# Patient Record
Sex: Male | Born: 1970 | Race: White | Hispanic: No | Marital: Married
Health system: Southern US, Community
[De-identification: ages and names within clinical notes are randomized; demographics above are authoritative.]

---

## 2009-12-03 ENCOUNTER — Encounter: Admission: RE | Admit: 2009-12-03 | Discharge: 2009-12-03 | Payer: Self-pay | Admitting: Internal Medicine

## 2013-03-05 ENCOUNTER — Telehealth (HOSPITAL_COMMUNITY): Payer: Self-pay | Admitting: Internal Medicine

## 2013-03-13 ENCOUNTER — Other Ambulatory Visit (HOSPITAL_COMMUNITY): Payer: Self-pay | Admitting: Internal Medicine

## 2013-03-13 ENCOUNTER — Telehealth (HOSPITAL_COMMUNITY): Payer: Self-pay | Admitting: Internal Medicine

## 2013-03-13 DIAGNOSIS — Z8249 Family history of ischemic heart disease and other diseases of the circulatory system: Secondary | ICD-10-CM

## 2013-03-19 ENCOUNTER — Telehealth (HOSPITAL_COMMUNITY): Payer: Self-pay | Admitting: Internal Medicine

## 2013-03-25 ENCOUNTER — Encounter (HOSPITAL_COMMUNITY): Payer: Self-pay | Admitting: *Deleted

## 2013-03-26 ENCOUNTER — Telehealth (HOSPITAL_COMMUNITY): Payer: Self-pay | Admitting: *Deleted

## 2013-04-07 ENCOUNTER — Telehealth (HOSPITAL_COMMUNITY): Payer: Self-pay | Admitting: *Deleted

## 2013-04-08 ENCOUNTER — Ambulatory Visit (HOSPITAL_COMMUNITY)
Admission: RE | Admit: 2013-04-08 | Discharge: 2013-04-08 | Disposition: A | Discharge status: Home / Self Care | Payer: Managed Care, Other (non HMO) | Source: Ambulatory Visit | Attending: Cardiology | Admitting: Cardiology

## 2013-04-08 DIAGNOSIS — E669 Obesity, unspecified: Secondary | ICD-10-CM | POA: Insufficient documentation

## 2013-04-08 DIAGNOSIS — Z8249 Family history of ischemic heart disease and other diseases of the circulatory system: Secondary | ICD-10-CM | POA: Insufficient documentation

## 2013-09-08 ENCOUNTER — Other Ambulatory Visit: Payer: Self-pay | Admitting: Internal Medicine

## 2013-09-08 DIAGNOSIS — R7989 Other specified abnormal findings of blood chemistry: Secondary | ICD-10-CM

## 2013-09-30 ENCOUNTER — Ambulatory Visit
Admission: RE | Admit: 2013-09-30 | Discharge: 2013-09-30 | Disposition: A | Discharge status: Home / Self Care | Payer: Managed Care, Other (non HMO) | Source: Ambulatory Visit | Attending: Internal Medicine | Admitting: Internal Medicine

## 2013-09-30 DIAGNOSIS — R7989 Other specified abnormal findings of blood chemistry: Secondary | ICD-10-CM

## 2020-10-14 ENCOUNTER — Other Ambulatory Visit: Payer: Self-pay | Admitting: Internal Medicine

## 2020-10-14 DIAGNOSIS — E7800 Pure hypercholesterolemia, unspecified: Secondary | ICD-10-CM

## 2020-11-29 ENCOUNTER — Ambulatory Visit
Admission: RE | Admit: 2020-11-29 | Discharge: 2020-11-29 | Disposition: A | Discharge status: Home / Self Care | Payer: No Typology Code available for payment source | Source: Ambulatory Visit | Attending: Internal Medicine | Admitting: Internal Medicine

## 2020-11-29 DIAGNOSIS — E7800 Pure hypercholesterolemia, unspecified: Secondary | ICD-10-CM

## 2021-11-08 ENCOUNTER — Other Ambulatory Visit (HOSPITAL_COMMUNITY): Payer: Self-pay

## 2021-11-08 MED ORDER — WEGOVY 1.7 MG/0.75ML ~~LOC~~ SOAJ
SUBCUTANEOUS | 2 refills | Status: AC
  Filled 2021-11-08: qty 3, 28d supply, fill #0

## 2021-11-11 ENCOUNTER — Other Ambulatory Visit: Payer: Self-pay | Admitting: Internal Medicine

## 2021-11-11 DIAGNOSIS — R911 Solitary pulmonary nodule: Secondary | ICD-10-CM

## 2021-12-02 ENCOUNTER — Other Ambulatory Visit: Payer: Managed Care, Other (non HMO)

## 2021-12-09 IMAGING — CT CT CARDIAC CORONARY ARTERY CALCIUM SCORE
3 series · 14 of 20 positions shown, 16 images · non-contrast
Comparison: None.

CLINICAL DATA: Hyperlipidemia

EXAM:
CT CARDIAC CORONARY ARTERY CALCIUM SCORE
TECHNIQUE: Non-contrast imaging through the heart was performed using
prospective ECG gating. Image post processing was performed on an
independent workstation, allowing for quantitative analysis of the
heart and coronary arteries. Note that this exam targets the heart
and the chest was not imaged in its entirety.

[Series 2: calcium scoring 2.00 qr36 bestdiast 71% hrt calciu · axial · 0.42mm/px · z∈[+1634,+1706]mm · 4 of 60 slices shown]
[im 12/60  vessel]
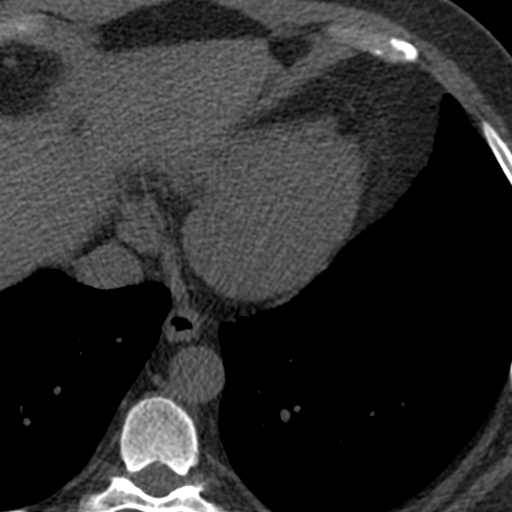
[im 24/60  vessel]
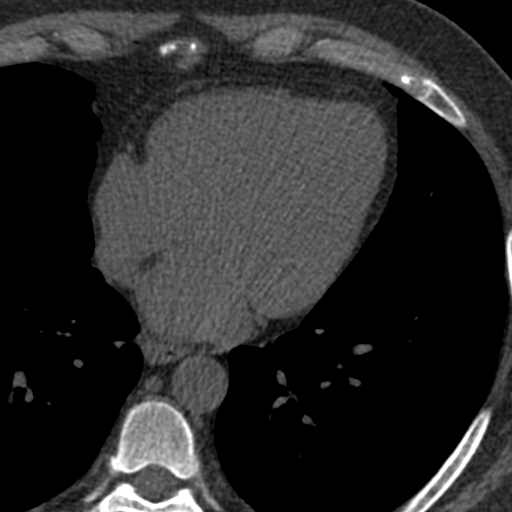
[im 36/60  vessel]
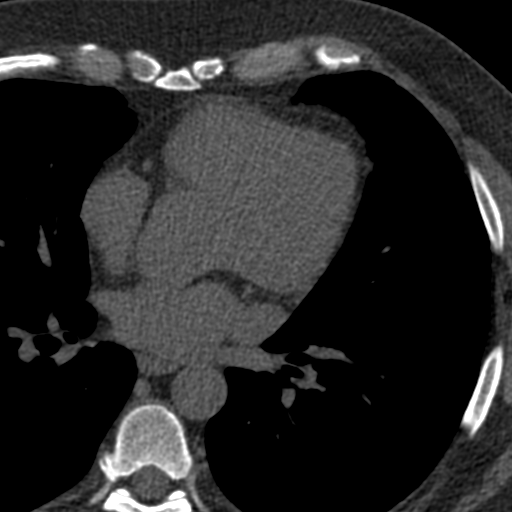
[im 48/60  vessel]
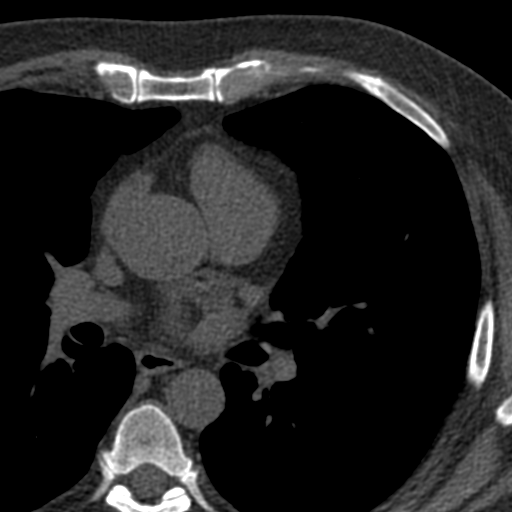

[Series 3: calcium scoring 2.00 br40 bestdiast 71% axial · axial · 0.60mm/px · z∈[+1630,+1710]mm · 5 of 60 slices shown, 7 images]
[im 10/60  vessel]
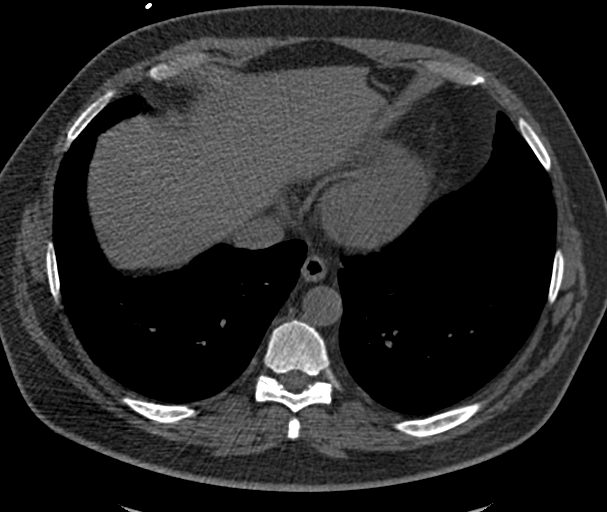
[im 10/60  lung]
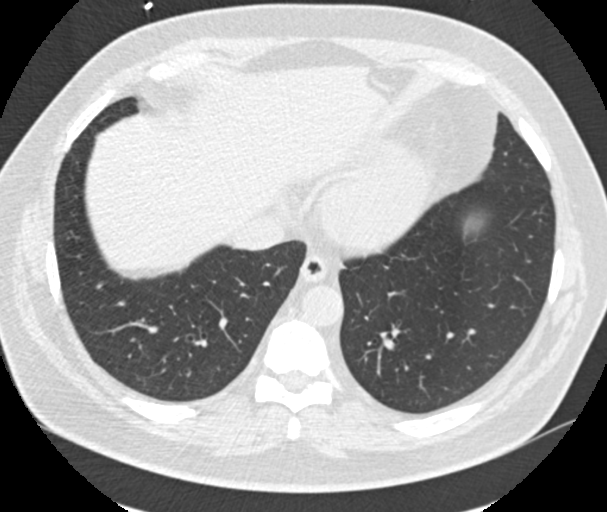
[im 20/60  vessel]
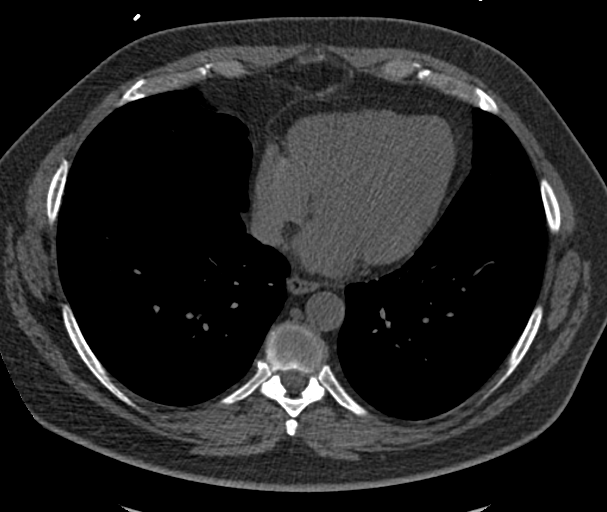
[im 30/60  vessel]
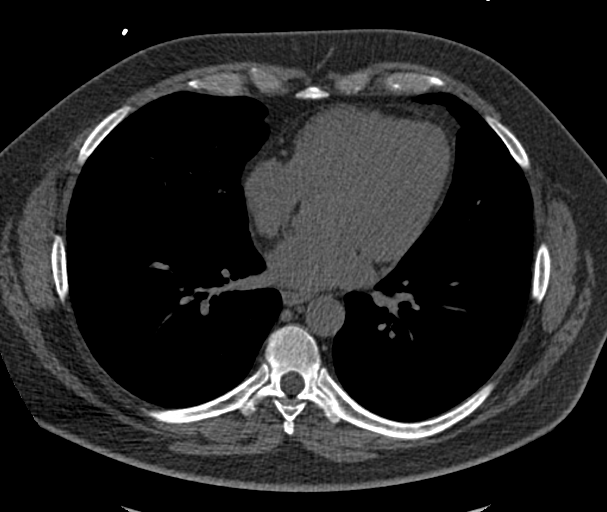
[im 40/60  vessel]
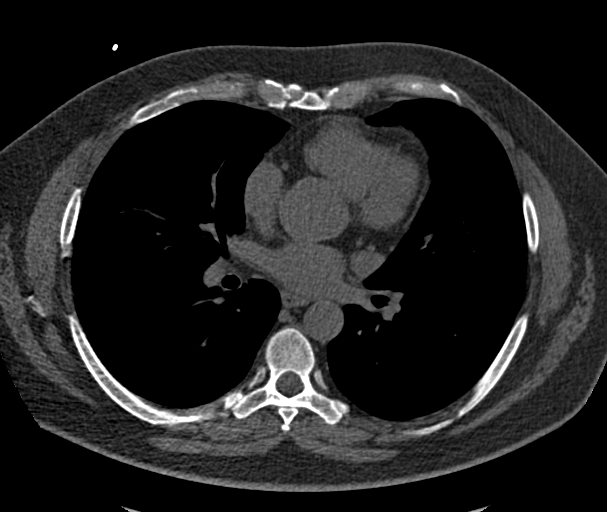
[im 50/60  vessel]
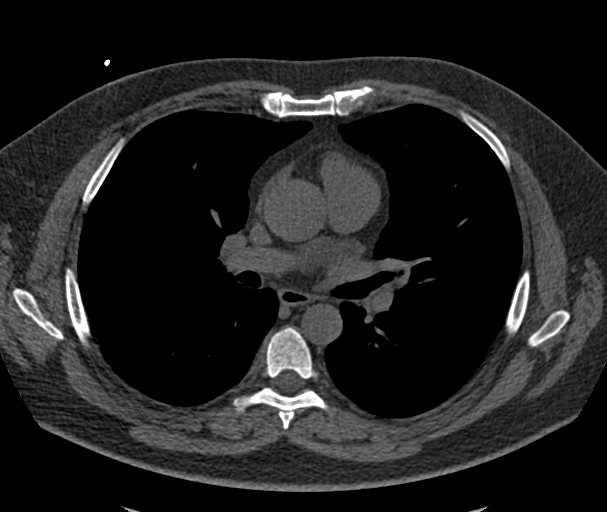
[im 50/60  lung]
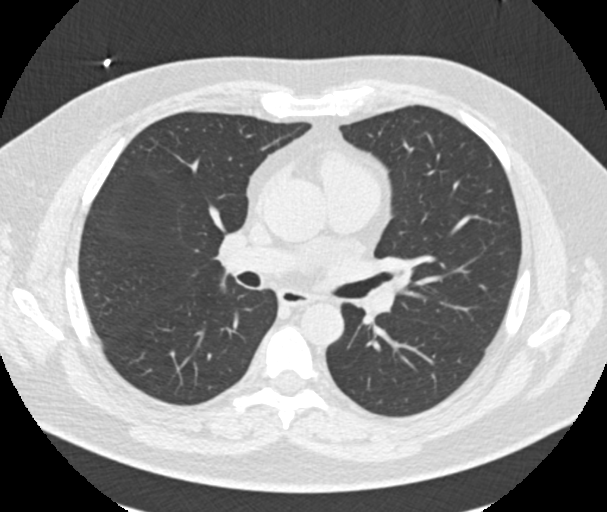

[Series 9: calcium scoring 2.00 br60 bestdiast 71% lungs · axial · 0.60mm/px · z∈[+1630,+1710]mm · 5 of 60 slices shown]
[im 10/60  vessel]
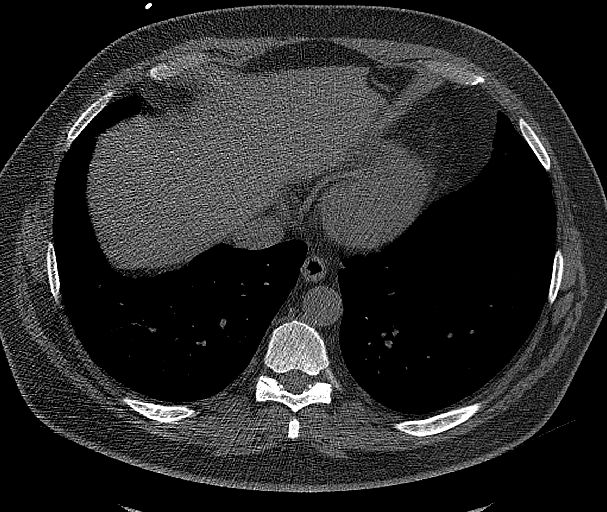
[im 20/60  vessel]
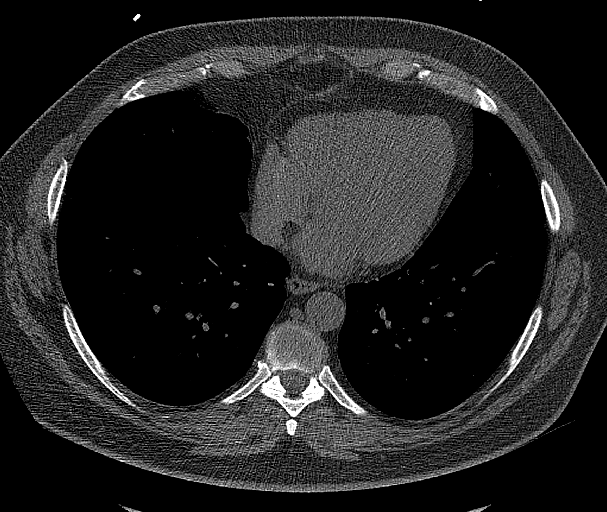
[im 30/60  vessel]
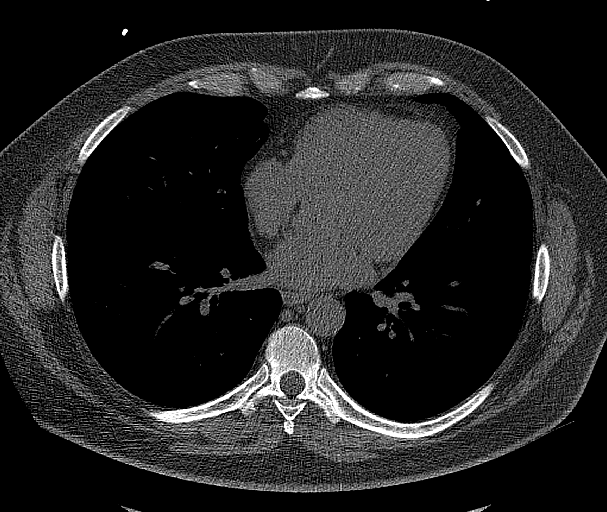
[im 40/60  vessel]
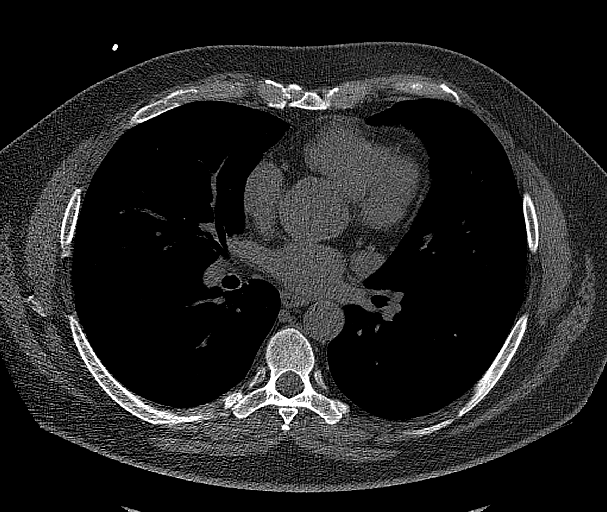
[im 50/60  vessel]
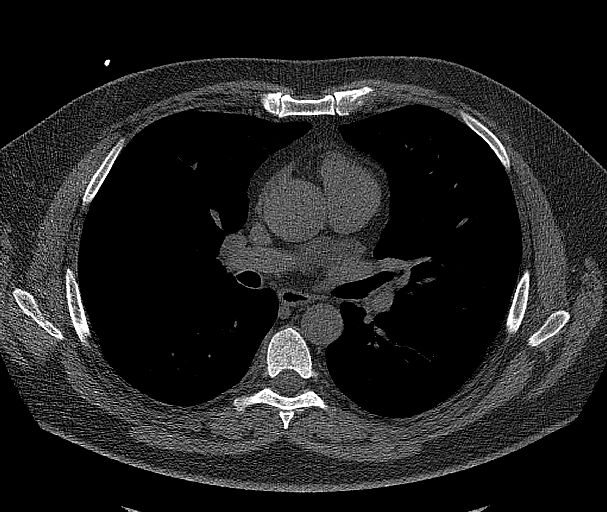

[14 of 20 positions shown; findings below may reference images not displayed]

FINDINGS: CORONARY CALCIUM SCORES:

Left Main: 0

LAD: 0

LCx: 0

RCA: 0

Total Agatston Score: 0

[HOSPITAL] percentile: 0

AORTA MEASUREMENTS:

Ascending Aorta: 38 mm

Descending Aorta: 25 mm

OTHER FINDINGS:

Heart is normal size. Aorta normal caliber. No adenopathy. Small
nodule at the left lung base measures 5 mm on image 59. Visualized
lungs otherwise clear. No effusions. Imaging into the upper abdomen
demonstrates no acute findings. Chest wall soft tissues are
unremarkable. No acute bony abnormality.
IMPRESSION: No visible coronary artery calcifications. Total coronary calcium
score of 0.

5 mm nodule at the left lung base. No follow-up needed if patient is
low-risk. Non-contrast chest CT can be considered in 12 months if
patient is high-risk. This recommendation follows the consensus
statement: Guidelines for Management of Incidental Pulmonary Nodules
Detected on CT Images: From the [HOSPITAL] 6088; Radiology

## 2022-05-19 ENCOUNTER — Other Ambulatory Visit: Payer: Self-pay | Admitting: Internal Medicine

## 2022-05-19 DIAGNOSIS — R911 Solitary pulmonary nodule: Secondary | ICD-10-CM

## 2022-06-23 ENCOUNTER — Ambulatory Visit
Admission: RE | Admit: 2022-06-23 | Discharge: 2022-06-23 | Disposition: A | Discharge status: Home / Self Care | Payer: Managed Care, Other (non HMO) | Source: Ambulatory Visit | Attending: Internal Medicine | Admitting: Internal Medicine

## 2022-06-23 DIAGNOSIS — R911 Solitary pulmonary nodule: Secondary | ICD-10-CM

## 2023-01-17 ENCOUNTER — Other Ambulatory Visit (HOSPITAL_COMMUNITY): Payer: Self-pay
